# Patient Record
Sex: Female | Born: 2000 | Race: White | Hispanic: No | Marital: Single | Smoking: Never smoker
Health system: Southern US, Community
[De-identification: ages and names within clinical notes are randomized; demographics above are authoritative.]

## PROBLEM LIST (undated history)

## (undated) DIAGNOSIS — N390 Urinary tract infection, site not specified: Secondary | ICD-10-CM

## (undated) DIAGNOSIS — R040 Epistaxis: Secondary | ICD-10-CM

## (undated) HISTORY — DX: Urinary tract infection, site not specified: N39.0

## (undated) HISTORY — DX: Epistaxis: R04.0

---
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
## 2020-01-08 IMAGING — CT CT ABD-PELV W/ CM
2 of 4 series · 16 of 46 positions shown, 18 images · IV contrast (APPLIED)
Comparison: None.

CLINICAL DATA: Right lower quadrant pain and nausea

EXAM:
CT ABDOMEN AND PELVIS WITH CONTRAST
TECHNIQUE: Multidetector CT imaging of the abdomen and pelvis was performed
using the standard protocol following bolus administration of
intravenous contrast.
CONTRAST:  100mL [id number]-RCC IOPAMIDOL ([id number]-RCC) INJECTION 61%

[Series 2: axial st · axial · 0.94mm/px · z∈[-430,-6]mm · 13 of 93 slices shown, 15 images]
[im 4/93  soft-tissue]
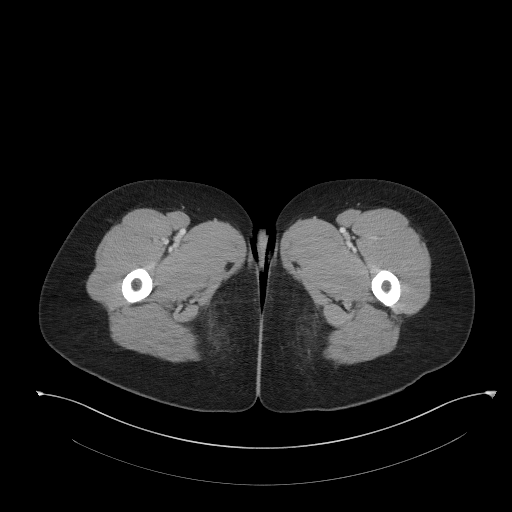
[im 4/93  bone]
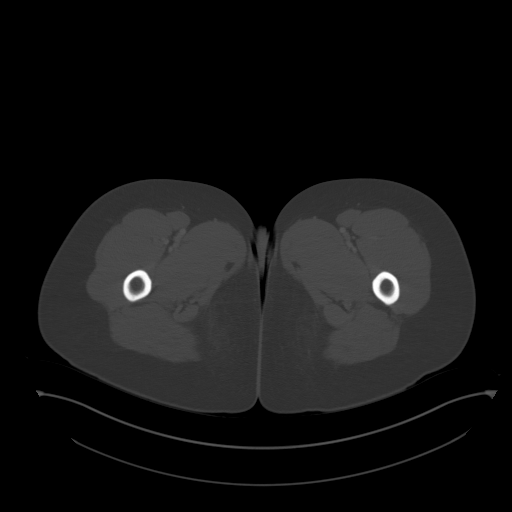
[im 12/93  soft-tissue]
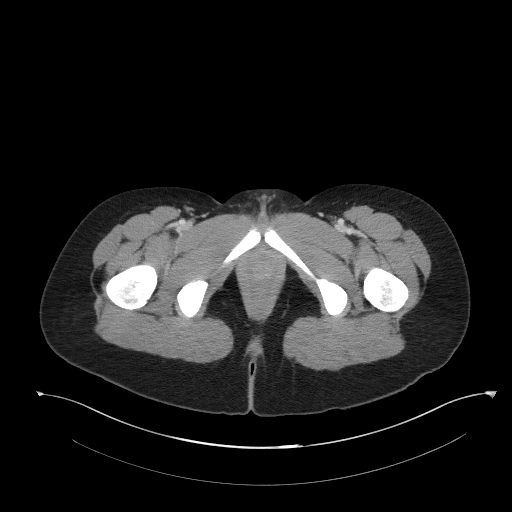
[im 19/93  soft-tissue]
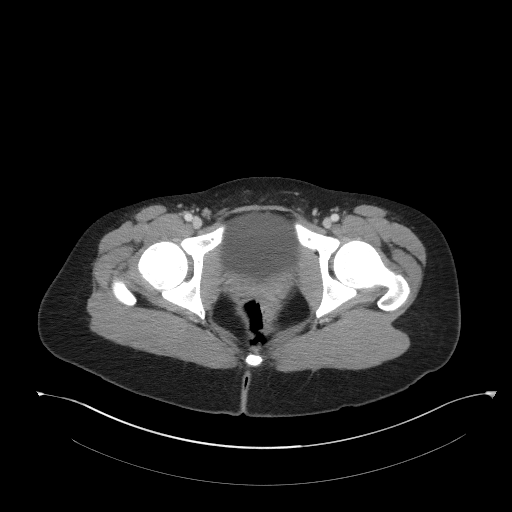
[im 26/93  soft-tissue]
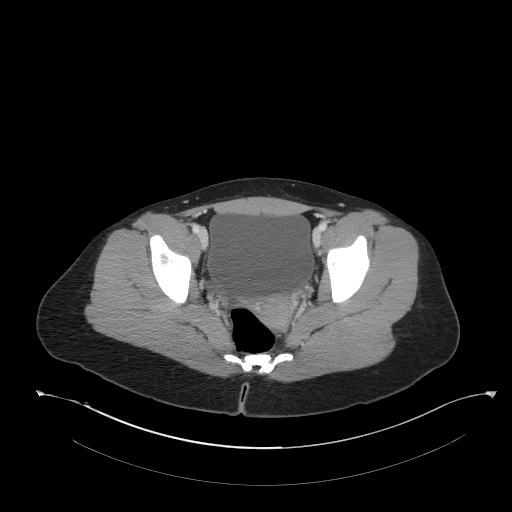
[im 34/93  soft-tissue]
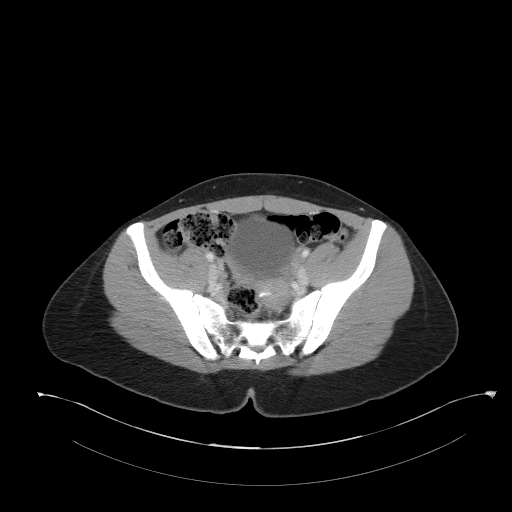
[im 41/93  soft-tissue]
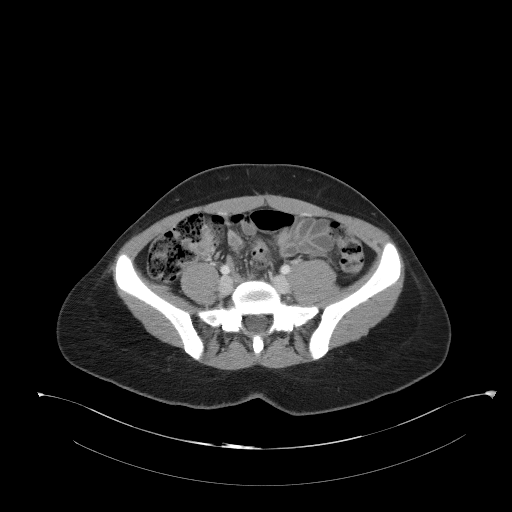
[im 48/93  soft-tissue]
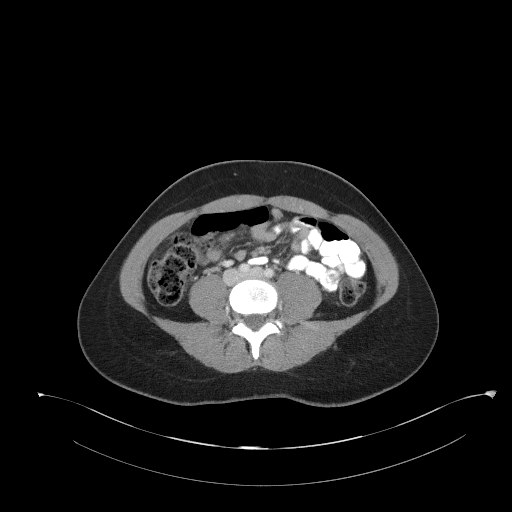
[im 52/93  soft-tissue]
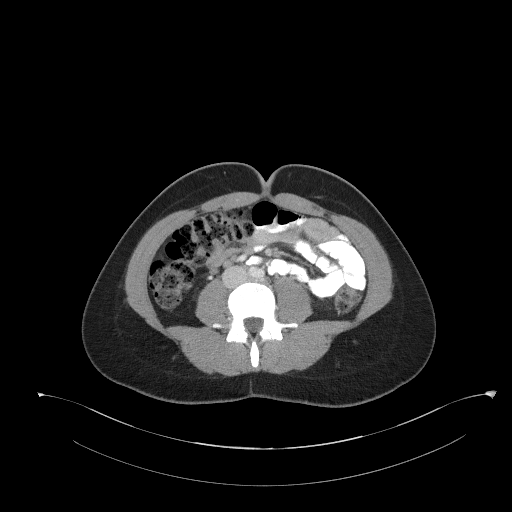
[im 59/93  soft-tissue]
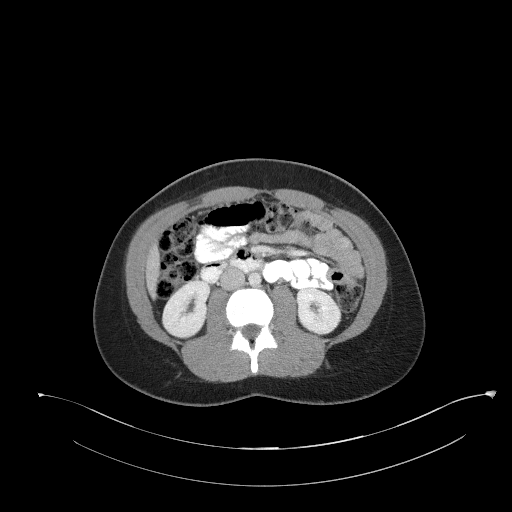
[im 59/93  bone]
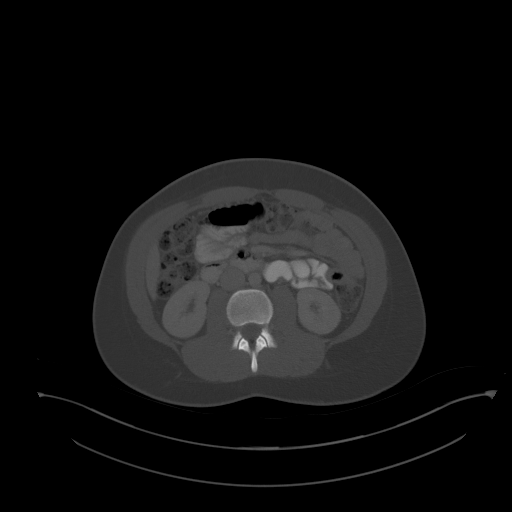
[im 67/93  soft-tissue]
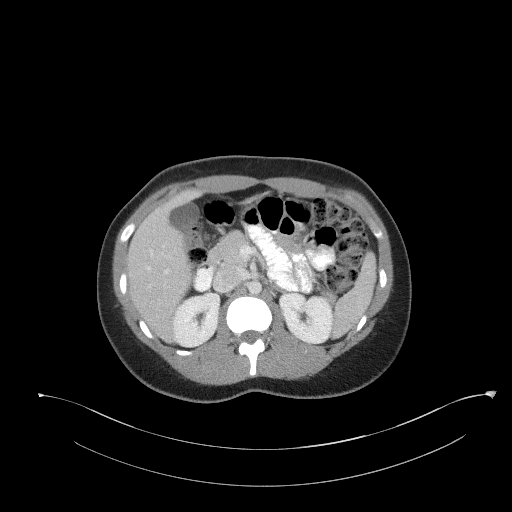
[im 74/93  soft-tissue]
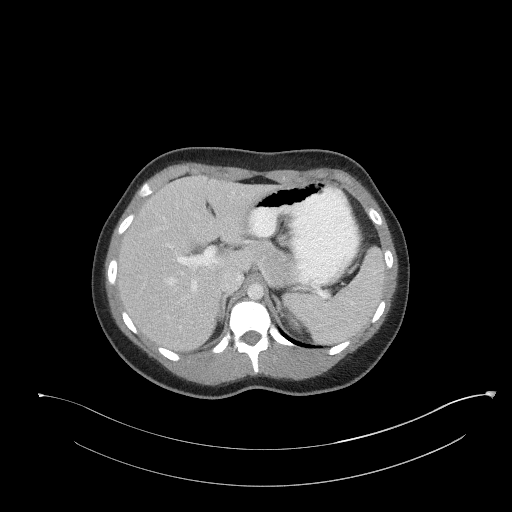
[im 81/93  soft-tissue]
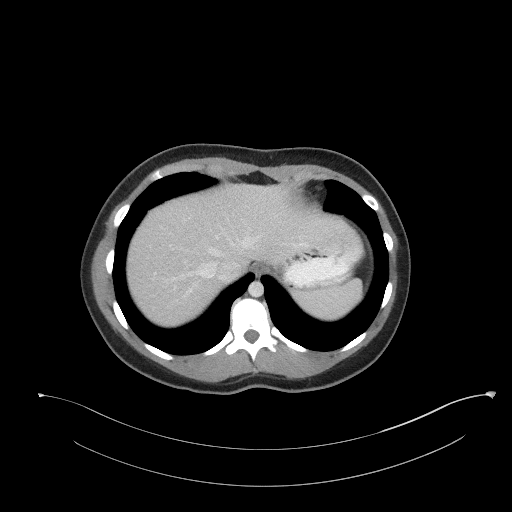
[im 89/93  soft-tissue]
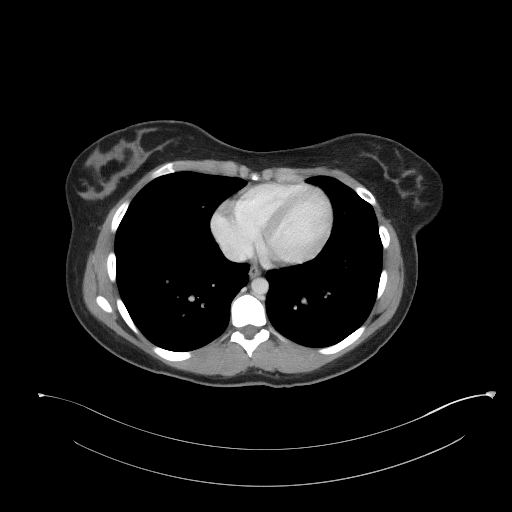

[Series 5: coronal st · coronal · 0.76mm/px · 3 of 92 slices shown]
[im 31/92  soft-tissue]
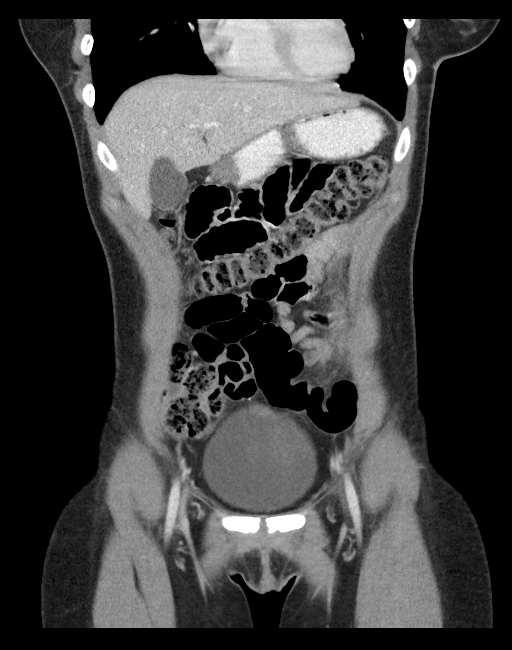
[im 41/92  soft-tissue]
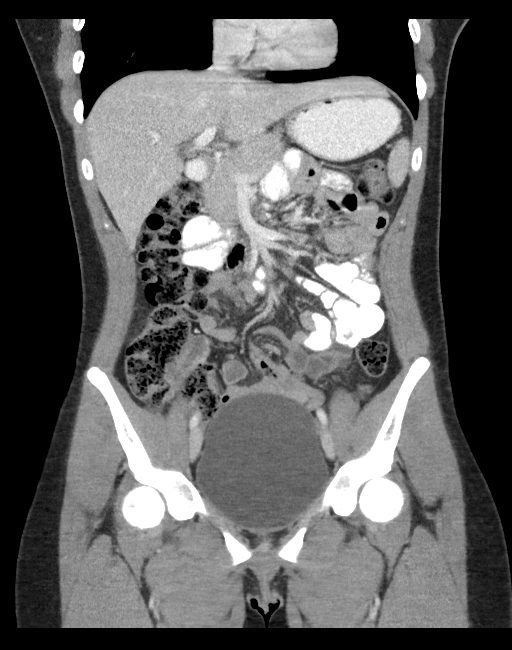
[im 51/92  soft-tissue]
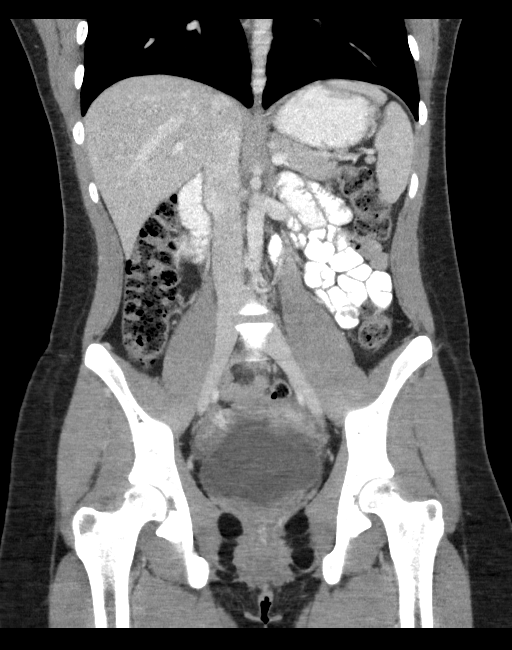

[16 of 46 positions shown; findings below may reference images not displayed]

FINDINGS: LOWER CHEST: There is no basilar pleural or apical pericardial
effusion.

HEPATOBILIARY: The hepatic contours and density are normal. There is
no intra- or extrahepatic biliary dilatation. The gallbladder is
normal.

PANCREAS: The pancreatic parenchymal contours are normal and there
is no ductal dilatation. There is no peripancreatic fluid
collection.

SPLEEN: Normal.

ADRENALS/URINARY TRACT:

--Adrenal glands: Normal.

--Right kidney/ureter: No hydronephrosis, nephroureterolithiasis,
perinephric stranding or solid renal mass.

--Left kidney/ureter: No hydronephrosis, nephroureterolithiasis,
perinephric stranding or solid renal mass.

--Urinary bladder: Normal for degree of distention

STOMACH/BOWEL:

--Stomach/Duodenum: There is no hiatal hernia or other gastric
abnormality. The duodenal course and caliber are normal.

--Small bowel: No dilatation or inflammation.

--Colon: No focal abnormality.

--Appendix: Normal.

VASCULAR/LYMPHATIC: Normal course and caliber of the major abdominal
vessels. No abdominal or pelvic lymphadenopathy.

REPRODUCTIVE: There is a T-shaped contraceptive device within the
uterus. The ovaries are normal.

MUSCULOSKELETAL. No bony spinal canal stenosis or focal osseous
abnormality.

OTHER: None.
IMPRESSION: No acute abdominal or pelvic abnormality.  Normal appendix.

## 2020-04-04 ENCOUNTER — Emergency Department (HOSPITAL_BASED_OUTPATIENT_CLINIC_OR_DEPARTMENT_OTHER): Payer: Commercial Managed Care - PPO

## 2020-04-04 ENCOUNTER — Encounter (HOSPITAL_BASED_OUTPATIENT_CLINIC_OR_DEPARTMENT_OTHER): Payer: Self-pay | Admitting: *Deleted

## 2020-04-04 ENCOUNTER — Emergency Department (HOSPITAL_BASED_OUTPATIENT_CLINIC_OR_DEPARTMENT_OTHER)
Admission: EM | Admit: 2020-04-04 | Discharge: 2020-04-04 | Disposition: A | Discharge status: Home / Self Care | Payer: Commercial Managed Care - PPO | Attending: Emergency Medicine | Admitting: Emergency Medicine

## 2020-04-04 ENCOUNTER — Other Ambulatory Visit: Payer: Self-pay

## 2020-04-04 DIAGNOSIS — U071 COVID-19: Secondary | ICD-10-CM | POA: Insufficient documentation

## 2020-04-04 DIAGNOSIS — J45909 Unspecified asthma, uncomplicated: Secondary | ICD-10-CM | POA: Insufficient documentation

## 2020-04-04 DIAGNOSIS — R509 Fever, unspecified: Secondary | ICD-10-CM

## 2020-04-04 DIAGNOSIS — Z7951 Long term (current) use of inhaled steroids: Secondary | ICD-10-CM | POA: Diagnosis not present

## 2020-04-04 DIAGNOSIS — M545 Low back pain: Secondary | ICD-10-CM | POA: Diagnosis present

## 2020-04-04 LAB — COMPREHENSIVE METABOLIC PANEL
ALT: 19 U/L (ref 0–44)
AST: 23 U/L (ref 15–41)
Albumin: 4.7 g/dL (ref 3.5–5.0)
Alkaline Phosphatase: 52 U/L (ref 38–126)
Anion gap: 14 (ref 5–15)
BUN: 9 mg/dL (ref 6–20)
CO2: 20 mmol/L — ABNORMAL LOW (ref 22–32)
Calcium: 9.2 mg/dL (ref 8.9–10.3)
Chloride: 104 mmol/L (ref 98–111)
Creatinine, Ser: 0.87 mg/dL (ref 0.44–1.00)
GFR calc Af Amer: >60 mL/min (ref >60)
GFR calc non Af Amer: >60 mL/min (ref >60)
Glucose, Bld: 77 mg/dL (ref 70–99)
Potassium: 3.6 mmol/L (ref 3.5–5.1)
Sodium: 138 mmol/L (ref 135–145)
Total Bilirubin: 0.4 mg/dL (ref 0.3–1.2)
Total Protein: 7.7 g/dL (ref 6.5–8.1)

## 2020-04-04 LAB — CBC WITH DIFFERENTIAL/PLATELET
Abs Immature Granulocytes: 0.02 10*3/uL (ref 0.00–0.07)
Basophils Absolute: 0 10*3/uL (ref 0.0–0.1)
Basophils Relative: 0 %
Eosinophils Absolute: 0 10*3/uL (ref 0.0–0.5)
Eosinophils Relative: 1 %
HCT: 41 % (ref 36.0–46.0)
Hemoglobin: 13.7 g/dL (ref 12.0–15.0)
Immature Granulocytes: 0 %
Lymphocytes Relative: 13 %
Lymphs Abs: 0.6 10*3/uL — ABNORMAL LOW (ref 0.7–4.0)
MCH: 29.4 pg (ref 26.0–34.0)
MCHC: 33.4 g/dL (ref 30.0–36.0)
MCV: 88 fL (ref 80.0–100.0)
Monocytes Absolute: 0.5 10*3/uL (ref 0.1–1.0)
Monocytes Relative: 11 %
Neutro Abs: 3.4 10*3/uL (ref 1.7–7.7)
Neutrophils Relative %: 75 %
Platelets: 242 10*3/uL (ref 150–400)
RBC: 4.66 MIL/uL (ref 3.87–5.11)
RDW: 13 % (ref 11.5–15.5)
WBC: 4.5 10*3/uL (ref 4.0–10.5)
nRBC: 0 % (ref 0.0–0.2)

## 2020-04-04 LAB — URINALYSIS, ROUTINE W REFLEX MICROSCOPIC
Bilirubin Urine: NEGATIVE
Glucose, UA: NEGATIVE mg/dL
Ketones, ur: 15 mg/dL — AB
Leukocytes,Ua: NEGATIVE
Nitrite: NEGATIVE
Protein, ur: NEGATIVE mg/dL
Specific Gravity, Urine: 1.025 (ref 1.005–1.030)
pH: 6.5 (ref 5.0–8.0)

## 2020-04-04 LAB — URINALYSIS, MICROSCOPIC (REFLEX)

## 2020-04-04 LAB — GROUP A STREP BY PCR: Group A Strep by PCR: NOT DETECTED

## 2020-04-04 LAB — PREGNANCY, URINE: Preg Test, Ur: NEGATIVE

## 2020-04-04 MED ORDER — KETOROLAC TROMETHAMINE 30 MG/ML IJ SOLN
15.0000 mg | Freq: Once | INTRAMUSCULAR | Status: AC
  Administered 2020-04-04: 15 mg via INTRAVENOUS
  Filled 2020-04-04: qty 1

## 2020-04-04 MED ORDER — METOCLOPRAMIDE HCL 5 MG/ML IJ SOLN
5.0000 mg | Freq: Once | INTRAMUSCULAR | Status: AC
  Administered 2020-04-04: 5 mg via INTRAVENOUS
  Filled 2020-04-04: qty 2

## 2020-04-04 MED ORDER — ONDANSETRON HCL 4 MG PO TABS: 4.0000 mg | ORAL_TABLET | Freq: Three times a day (TID) | ORAL | 0 refills | Status: AC | PRN

## 2020-04-04 MED ORDER — DEXAMETHASONE SODIUM PHOSPHATE 10 MG/ML IJ SOLN
10.0000 mg | Freq: Once | INTRAMUSCULAR | Status: AC
  Administered 2020-04-04: 10 mg via INTRAVENOUS
  Filled 2020-04-04: qty 1

## 2020-04-04 NOTE — ED Notes (Signed)
ED Provider at bedside. 

## 2020-04-04 NOTE — ED Notes (Signed)
Pt. Reports she has had her meningitis vaccines.    Pt. Said she has a constant headache and back ache for 3 days and is sensitive to light.  Pt. Reports no change in vision.  Pt. Reports no nausea.  Pt. Reports her back aches.

## 2020-04-04 NOTE — ED Triage Notes (Addendum)
Fever, cough, headache, neck pain and back pain. She was seen by her MD this am. 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

## 2020-04-04 NOTE — ED Provider Notes (Signed)
[facility] EMERGENCY DEPARTMENT Provider Note   CSN: [id number] Arrival date & time: 04/04/20  1301     History Chief Complaint  Patient presents with  . Fever  . Back Pain    Felicia Dennis is a 19 y.o. female sent from urgent care for concerns of meningitis.  Patient complains of 3 days of severe, intractable headache, right-sided jaw pain, back pain.  She has associated light sensitivity.  Patient began running a high fever with associated myalgias, decreased appetite, and cough which began today.  He has a history of recurrent urinary tract infections but denies any urinary symptoms.  She denies a history of migraine headaches.  She has been taking Tylenol Motrin without relief of symptoms.  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  She is tested weekly for Covid and has been negative.  HPI     Past Medical History:  Diagnosis Date  . Asthma 09/14/2011  . Epistaxis 2008   recurrent epistaxis  . Urinary tract infection 11/2008   E. Coli    Patient Active Problem List   Diagnosis Date Noted  . Viral URI with cough 06/29/2013  . Mild intermittent asthma with exacerbation 06/29/2013  . Allergic rhinitis 06/29/2013  . Otitis, externa, infective 04/20/2013  . Asthma 09/14/2011    Past Surgical History:  Procedure Laterality Date  . ADENOIDECTOMY  11/2007  . anterior cautery  12/08/2007   cautery for recurrent epistaxis  . TONSILLECTOMY  11/2007     OB History   No obstetric history on file.     No family history on file.  Social History   Tobacco Use  . Smoking status: Never Smoker  . Smokeless tobacco: Never Used  Vaping Use  . Vaping Use: Never used  Substance Use Topics  . Alcohol use: No  . Drug use: No    Home Medications Prior to Admission medications   Medication Sig Start Date End Date Taking? Authorizing Provider  albuterol (VENTOLIN HFA) 108 (90 BASE) MCG/ACT inhaler  Inhale 2 puffs into the lungs every 4 (four) hours as needed for wheezing or shortness of breath (15 minutes prior to exercise). 06/29/13  Yes Whitaker, Ethelda Chick, NP  beclomethasone (QVAR REDIHALER) 40 MCG/ACT inhaler Inhale 1 puff into the lungs 2 (two) times daily. 02/21/20  Yes [provider]  cephALEXin (KEFLEX) 500 MG capsule Take 1 capsule (500 mg total) by mouth 4 (four) times daily. 08/04/19   Dahlia Byes A, NP  mupirocin cream (BACTROBAN) 2 % Apply 1 application topically 2 (two) times daily. 08/04/19   Dahlia Byes A, NP  neomycin-polymyxin-hydrocortisone (CORTISPORIN) OTIC solution Apply 2-3 drops to the ingrown toenail site twice daily. Cover with band-aid. 08/08/19   Park Liter, DPM  neomycin-polymyxin-hydrocortisone (CORTISPORIN) OTIC solution Apply 2-3 drops to the ingrown toenail site twice daily. Cover with band-aid. 10/20/19   Asencion Islam, DPM  ondansetron (ZOFRAN) 4 MG tablet Take 1 tablet (4 mg total) by mouth every 8 (eight) hours as needed for nausea or vomiting. 04/04/20   Arthor Captain, PA-C    Allergies    Amoxicillin, Other, and Penicillins  Review of Systems   Review of Systems Ten systems reviewed and are negative for acute change, except as noted in the HPI.   Physical Exam Updated Vital Signs BP 108/67 (BP Location: Right Arm)   Pulse 61   Temp 98.6 F (37 C) (Oral)   Resp 16  Ht 5\' 2"  (1.575 m)   Wt 70.3 kg   SpO2 99%   BMI 28.35 kg/m   Physical Exam Vitals and nursing note reviewed.  Constitutional:      General: She is not in acute distress.    Appearance: She is well-developed. She is ill-appearing. She is not diaphoretic.     Comments: Hot to the touch  HENT:     Head: Normocephalic and atraumatic.  Eyes:     General: No scleral icterus.    Conjunctiva/sclera: Conjunctivae normal.     Pupils: Pupils are equal, round, and reactive to light.     Comments: No horizontal, vertical or rotational nystagmus  Neck:     Comments: Full  active and passive ROM without pain No midline or paraspinal tenderness No nuchal rigidity or meningeal signs Cardiovascular:     Rate and Rhythm: Normal rate and regular rhythm.  Pulmonary:     Effort: Pulmonary effort is normal. No respiratory distress.     Breath sounds: Normal breath sounds. No wheezing or rales.  Abdominal:     General: Bowel sounds are normal.     Palpations: Abdomen is soft.     Tenderness: There is no abdominal tenderness. There is no guarding or rebound.  Musculoskeletal:        General: Normal range of motion.     Cervical back: Normal range of motion and neck supple.  Lymphadenopathy:     Cervical: No cervical adenopathy.  Skin:    General: Skin is warm and dry.     Findings: No rash.  Neurological:     Mental Status: She is alert and oriented to person, place, and time.     Cranial Nerves: No cranial nerve deficit.     Motor: No abnormal muscle tone.     Coordination: Coordination normal.     Deep Tendon Reflexes: Reflexes are normal and symmetric.     Comments: Mental Status:  Alert, oriented, thought content appropriate. Speech fluent without evidence of aphasia. Able to follow 2 step commands without difficulty.  Cranial Nerves:  II:  Peripheral visual fields grossly normal, pupils equal, round, reactive to light III,IV, VI: ptosis not present, extra-ocular motions intact bilaterally  V,VII: smile symmetric, facial light touch sensation equal VIII: hearing grossly normal bilaterally  IX,X: midline uvula rise  XI: bilateral shoulder shrug equal and strong XII: midline tongue extension  Motor:  5/5 in upper and lower extremities bilaterally including strong and equal grip strength and dorsiflexion/plantar flexion Sensory: Pinprick and light touch normal in all extremities.  Deep Tendon Reflexes: 2+ and symmetric  Cerebellar: normal finger-to-nose with bilateral upper extremities Gait: normal gait and balance CV: distal pulses palpable throughout    Psychiatric:        Behavior: Behavior normal.        Thought Content: Thought content normal.        Judgment: Judgment normal.     ED Results / Procedures / Treatments   Labs (all labs ordered are listed, but only abnormal results are displayed) Labs Reviewed  SARS CORONAVIRUS 2 BY RT PCR (HOSPITAL ORDER, PERFORMED IN  HOSPITAL LAB) - Abnormal; Notable for the following components:      Result Value   SARS Coronavirus 2 POSITIVE (*)    All other components within normal limits  URINALYSIS, ROUTINE W REFLEX MICROSCOPIC - Abnormal; Notable for the following components:   Hgb urine dipstick TRACE (*)    Ketones, ur 15 (*)  All other components within normal limits  COMPREHENSIVE METABOLIC PANEL - Abnormal; Notable for the following components:   CO2 20 (*)    All other components within normal limits  CBC WITH DIFFERENTIAL/PLATELET - Abnormal; Notable for the following components:   Lymphs Abs 0.6 (*)    All other components within normal limits  URINALYSIS, MICROSCOPIC (REFLEX) - Abnormal; Notable for the following components:   Bacteria, UA FEW (*)    All other components within normal limits  GROUP A STREP BY PCR  PREGNANCY, URINE  CBC WITH DIFFERENTIAL/PLATELET    EKG None  Radiology DG Chest Port 1 View  Result Date: 04/04/2020 CLINICAL DATA:  Fever cough EXAM: PORTABLE CHEST 1 VIEW COMPARISON:  None FINDINGS: The cardiomediastinal silhouette is normal in contour. No pleural effusion. No pneumothorax. No acute pleuroparenchymal abnormality. Visualized abdomen is unremarkable. No acute osseous abnormality noted. IMPRESSION: No acute cardiopulmonary abnormality. Electronically Signed   By: Meda KlinefelterStephanie  Peacock MD   On: 04/04/2020 13:45    Procedures Procedures (including critical care time)  Medications Ordered in ED Medications  dexamethasone (DECADRON) injection 10 mg (10 mg Intravenous Given 04/04/20 1507)  ketorolac (TORADOL) 30 MG/ML injection 15 mg  (15 mg Intravenous Given 04/04/20 1506)  metoCLOPramide (REGLAN) injection 5 mg (5 mg Intravenous Given 04/04/20 1507)    ED Course  I have reviewed the triage vital signs and the nursing notes.  Pertinent labs & imaging results that were available during my care of the patient were reviewed by me and considered in my medical decision making (see chart for details).  Clinical Course as of Apr 05 1299  Thu Apr 04, 2020  1555 Lymphocyte #(!): 0.6 [AH]  1639 HA currently 5/10 Awaiting COVID test. If negative, we will need to get a lumbar puncture.   [AH]    Clinical Course User Index [AH] Arthor CaptainHarris, Kaylana Fenstermacher, PA-C   MDM Rules/Calculators/A&P                          CC:flu like sxs/headache VS:  Vitals:   04/04/20 1503 04/04/20 1523 04/04/20 1700 04/04/20 1815  BP:  (!) 99/53 (!) 96/50 108/67  Pulse:  83 66 61  Resp:  17 18 16   Temp: 100.1 F (37.8 C)  98.6 F (37 C)   TempSrc: Oral  Oral   SpO2:  100% 99% 99%  Weight:      Height:        ZO:[id number]:History is gathered by patient and mother at bedside. Previous records obtained and reviewed. DDX:The patient's complaint of headache involves an extensive number of diagnostic and treatment options, and is a complaint that carries with it a high risk of complications, morbidity, and potential mortality. Given the large differential diagnosis, medical decision making is of high complexity. Emergent considerations for headache include subarachnoid hemorrhage, meningitis, temporal arteritis, glaucoma, cerebral ischemia, carotid/vertebral dissection, intracranial tumor, Venous sinus thrombosis, carbon monoxide poisoning, acute or chronic subdural hemorrhage.  Other considerations include: Migraine, Cluster headache, Hypertension, Caffeine, alcohol, or drug withdrawal, Pseudotumor cerebri, Arteriovenous malformation, Head injury, Neurocysticercosis, Post-lumbar puncture, Preeclampsia, Tension headache, Sinusitis, Cervical arthritis, Refractive error  causing strain, Dental abscess, Otitis media, Temporomandibular joint syndrome, Depression, Somatoform disorder (eg, somatization) Trigeminal neuralgia, Glossopharyngeal neuralgia.  Labs: I ordered reviewed and interpreted labs which include CMP which shows bicarb of 20, CBC which shows lymphopenia, urine without evidence of infection, negative strep test.  Covid is positive Imaging: I ordered and reviewed images which included  one-view portable chest x-ray. I independently visualized and interpreted all imaging. There are no acute, significant findings on today's images. EKG: Consults: MDM: Patient here with flulike symptoms and headache improved after treatment with fluids.  She has no meningismus.  I feel strongly that the patient's headache, body ache, backache and chills could be attributed to her acute viral process.  In shared decision-making the patient, her mother and myself have decided to forego lumbar puncture at this time as most likely the symptoms are related to the virus.  I discussed return precautions with the patient.  Patient is eligible for infusion therapy and has been referred.  Patient given Decadron, Reglan and Toradol here.  She may use over-the-counter cough medications for treatment of her symptoms.  Patient appears otherwise appropriate for discharge at this time. Patient disposition: Discharge The patient appears reasonably screened and/or stabilized for discharge and I doubt any other medical condition or other [facility] requiring further screening, evaluation, or treatment in the ED at this time prior to discharge. I have discussed lab and/or imaging findings with the patient and answered all questions/concerns to the best of my ability.I have discussed return precautions and OP follow up.     Final Clinical Impression(s) / ED Diagnoses Final diagnoses:  COVID-19 virus infection    Rx / DC Orders ED Discharge Orders         Ordered    ondansetron (ZOFRAN) 4 MG tablet   Every 8 hours PRN     Discontinue  Reprint     04/04/20 1802           Arthor Captain, PA-C 04/05/20 1300    Vanetta Mulders, MD 04/09/20 1835

## 2020-04-04 NOTE — Discharge Instructions (Signed)
Person Under Monitoring Name: [facility]  Location: [address] [place] [address]   Infection Prevention Recommendations for Individuals Confirmed to have, or Being Evaluated for, 2019 Novel Coronavirus (COVID-19) Infection Who Receive Care at Home  Individuals who are confirmed to have, or are being evaluated for, COVID-19 should follow the prevention steps below until a healthcare provider or local or state health department says they can return to normal activities.  Stay home except to get medical care You should restrict activities outside your home, except for getting medical care. Do not go to work, school, or public areas, and do not use public transportation or taxis.  Call ahead before visiting your doctor Before your medical appointment, call the healthcare provider and tell them that you have, or are being evaluated for, COVID-19 infection. This will help the healthcare provider's office take steps to keep other people from getting infected. Ask your healthcare provider to call the local or state health department.  Monitor your symptoms Seek prompt medical attention if your illness is worsening (e.g., difficulty breathing). Before going to your medical appointment, call the healthcare provider and tell them that you have, or are being evaluated for, COVID-19 infection. Ask your healthcare provider to call the local or state health department.  Wear a facemask You should wear a facemask that covers your nose and mouth when you are in the same room with other people and when you visit a healthcare provider. People who live with or visit you should also wear a facemask while they are in the same room with you.  Separate yourself from other people in your home As much as possible, you should stay in a different room from other people in your home. Also, you should use a separate bathroom, if available.  Avoid sharing household items You should not  share dishes, drinking glasses, cups, eating utensils, towels, bedding, or other items with other people in your home. After using these items, you should wash them thoroughly with soap and water.  Cover your coughs and sneezes Cover your mouth and nose with a tissue when you cough or sneeze, or you can cough or sneeze into your sleeve. Throw used tissues in a lined trash can, and immediately wash your hands with soap and water for at least 20 seconds or use an alcohol-based hand rub.  Wash your [employer] your hands often and thoroughly with soap and water for at least 20 seconds. You can use an alcohol-based hand sanitizer if soap and water are not available and if your hands are not visibly dirty. Avoid touching your eyes, nose, and mouth with unwashed hands.   Prevention Steps for Caregivers and Household Members of Individuals Confirmed to have, or Being Evaluated for, COVID-19 Infection Being Cared for in the Home  If you live with, or provide care at home for, a person confirmed to have, or being evaluated for, COVID-19 infection please follow these guidelines to prevent infection:  Follow healthcare provider's instructions Make sure that you understand and can help the patient follow any healthcare provider instructions for all care.  Provide for the patient's basic needs You should help the patient with basic needs in the home and provide support for getting groceries, prescriptions, and other personal needs.  Monitor the patient's symptoms If they are getting sicker, call his or her medical provider and tell them that the patient has, or is being evaluated for, COVID-19 infection. This will help the healthcare provider's office take  steps to keep other people from getting infected. Ask the healthcare provider to call the local or state health department.  Limit the number of people who have contact with the patient If possible, have only one caregiver for the  patient. Other household members should stay in another home or place of residence. If this is not possible, they should stay in another room, or be separated from the patient as much as possible. Use a separate bathroom, if available. Restrict visitors who do not have an essential need to be in the home.  Keep older adults, very young children, and other sick people away from the patient Keep older adults, very young children, and those who have compromised immune systems or chronic health conditions away from the patient. This includes people with chronic heart, lung, or kidney conditions, diabetes, and cancer.  Ensure good ventilation Make sure that shared spaces in the home have good air flow, such as from an air conditioner or an opened window, weather permitting.  Wash your hands often Wash your hands often and thoroughly with soap and water for at least 20 seconds. You can use an alcohol based hand sanitizer if soap and water are not available and if your hands are not visibly dirty. Avoid touching your eyes, nose, and mouth with unwashed hands. Use disposable paper towels to dry your hands. If not available, use dedicated cloth towels and replace them when they become wet.  Wear a facemask and gloves Wear a disposable facemask at all times in the room and gloves when you touch or have contact with the patient's blood, body fluids, and/or secretions or excretions, such as sweat, saliva, sputum, nasal mucus, vomit, urine, or feces.  Ensure the mask fits over your nose and mouth tightly, and do not touch it during use. Throw out disposable facemasks and gloves after using them. Do not reuse. Wash your hands immediately after removing your facemask and gloves. If your personal clothing becomes contaminated, carefully remove clothing and launder. Wash your hands after handling contaminated clothing. Place all used disposable facemasks, gloves, and other waste in a lined container before  disposing them with other household waste. Remove gloves and wash your hands immediately after handling these items.  Do not share dishes, glasses, or other household items with the patient Avoid sharing household items. You should not share dishes, drinking glasses, cups, eating utensils, towels, bedding, or other items with a patient who is confirmed to have, or being evaluated for, COVID-19 infection. After the person uses these items, you should wash them thoroughly with soap and water.  Wash laundry thoroughly Immediately remove and wash clothes or bedding that have blood, body fluids, and/or secretions or excretions, such as sweat, saliva, sputum, nasal mucus, vomit, urine, or feces, on them. Wear gloves when handling laundry from the patient. Read and follow directions on labels of laundry or clothing items and detergent. In general, wash and dry with the warmest temperatures recommended on the label.  Clean all areas the individual has used often Clean all touchable surfaces, such as counters, tabletops, doorknobs, bathroom fixtures, toilets, phones, keyboards, tablets, and bedside tables, every day. Also, clean any surfaces that may have blood, body fluids, and/or secretions or excretions on them. Wear gloves when cleaning surfaces the patient has come in contact with. Use a diluted bleach solution (e.g., dilute bleach with 1 part bleach and 10 parts water) or a household disinfectant with a label that says EPA-registered for coronaviruses. To make a bleach solution  at home, add 1 tablespoon of bleach to 1 quart (4 cups) of water. For a larger supply, add  cup of bleach to 1 gallon (16 cups) of water. Read labels of cleaning products and follow recommendations provided on product labels. Labels contain instructions for safe and effective use of the cleaning product including precautions you should take when applying the product, such as wearing gloves or eye protection and making sure you  have good ventilation during use of the product. Remove gloves and wash hands immediately after cleaning.  Monitor yourself for signs and symptoms of illness Caregivers and household members are considered close contacts, should monitor their health, and will be asked to limit movement outside of the home to the extent possible. Follow the monitoring steps for close contacts listed on the symptom monitoring form.   ? If you have additional questions, contact your local health department or call the epidemiologist on call at [phone] (available 24/7). ? This guidance is subject to change. For the most up-to-date guidance from [facility], please refer to their website: [email or web address]

## 2020-04-05 ENCOUNTER — Telehealth: Payer: Self-pay | Admitting: Physician Assistant

## 2020-04-05 NOTE — Telephone Encounter (Signed)
Called to discuss with patient about Covid symptoms and the use of bamlanivimab/etesevimab or casirivimab/imdevimab, a monoclonal antibody infusion for those with mild to moderate Covid symptoms and at a high risk of hospitalization.  Pt is qualified for this infusion at the [facility] due to asthma   Message left to call back our hotline [phone].  Cline Crock PA-C  [facility]

## 2020-04-08 LAB — SARS CORONAVIRUS 2 BY RT PCR (HOSPITAL ORDER, PERFORMED IN ~~LOC~~ HOSPITAL LAB): SARS Coronavirus 2: POSITIVE — AB

## 2020-04-09 ENCOUNTER — Ambulatory Visit: Payer: Commercial Managed Care - PPO | Admitting: Allergy
